# Patient Record
Sex: Female | Born: 1965
Health system: Southern US, Community
[De-identification: ages and names within clinical notes are randomized; demographics above are authoritative.]

## PROBLEM LIST (undated history)

## (undated) DIAGNOSIS — I10 Essential (primary) hypertension: Secondary | ICD-10-CM

## (undated) HISTORY — DX: Essential (primary) hypertension: I10

## (undated) HISTORY — PX: WRIST SURGERY: SHX841

---

## 1987-08-30 HISTORY — PX: UPPER GI ENDOSCOPY: SHX6162

## 2015-06-18 ENCOUNTER — Other Ambulatory Visit: Payer: Self-pay | Admitting: Obstetrics & Gynecology

## 2015-06-18 DIAGNOSIS — R928 Other abnormal and inconclusive findings on diagnostic imaging of breast: Secondary | ICD-10-CM

## 2015-06-24 ENCOUNTER — Ambulatory Visit
Admission: RE | Admit: 2015-06-24 | Discharge: 2015-06-24 | Disposition: A | Discharge status: Home / Self Care | Payer: Commercial Managed Care - PPO | Source: Ambulatory Visit | Attending: Obstetrics & Gynecology | Admitting: Obstetrics & Gynecology

## 2015-06-24 DIAGNOSIS — R928 Other abnormal and inconclusive findings on diagnostic imaging of breast: Secondary | ICD-10-CM

## 2015-06-25 ENCOUNTER — Other Ambulatory Visit: Payer: Self-pay

## 2015-11-17 ENCOUNTER — Other Ambulatory Visit: Payer: Self-pay | Admitting: Obstetrics & Gynecology

## 2015-11-17 DIAGNOSIS — N6489 Other specified disorders of breast: Secondary | ICD-10-CM

## 2015-11-17 DIAGNOSIS — N63 Unspecified lump in unspecified breast: Secondary | ICD-10-CM

## 2015-12-24 ENCOUNTER — Ambulatory Visit
Admission: RE | Admit: 2015-12-24 | Discharge: 2015-12-24 | Disposition: A | Discharge status: Home / Self Care | Payer: Commercial Managed Care - PPO | Source: Ambulatory Visit | Attending: Obstetrics & Gynecology | Admitting: Obstetrics & Gynecology

## 2015-12-24 DIAGNOSIS — N6489 Other specified disorders of breast: Secondary | ICD-10-CM

## 2016-05-04 ENCOUNTER — Other Ambulatory Visit: Payer: Self-pay | Admitting: Obstetrics & Gynecology

## 2016-05-04 DIAGNOSIS — Z1231 Encounter for screening mammogram for malignant neoplasm of breast: Secondary | ICD-10-CM

## 2016-06-24 ENCOUNTER — Ambulatory Visit
Admission: RE | Admit: 2016-06-24 | Discharge: 2016-06-24 | Disposition: A | Discharge status: Home / Self Care | Payer: Commercial Managed Care - PPO | Source: Ambulatory Visit | Attending: Obstetrics & Gynecology | Admitting: Obstetrics & Gynecology

## 2016-06-24 DIAGNOSIS — Z1231 Encounter for screening mammogram for malignant neoplasm of breast: Secondary | ICD-10-CM

## 2016-06-27 ENCOUNTER — Ambulatory Visit: Payer: Commercial Managed Care - PPO

## 2016-06-29 ENCOUNTER — Other Ambulatory Visit: Payer: Self-pay | Admitting: Obstetrics & Gynecology

## 2016-06-29 DIAGNOSIS — R928 Other abnormal and inconclusive findings on diagnostic imaging of breast: Secondary | ICD-10-CM

## 2016-07-05 ENCOUNTER — Ambulatory Visit
Admission: RE | Admit: 2016-07-05 | Discharge: 2016-07-05 | Disposition: A | Discharge status: Home / Self Care | Payer: Commercial Managed Care - PPO | Source: Ambulatory Visit | Attending: Obstetrics & Gynecology | Admitting: Obstetrics & Gynecology

## 2016-07-05 DIAGNOSIS — R928 Other abnormal and inconclusive findings on diagnostic imaging of breast: Secondary | ICD-10-CM

## 2017-02-08 DIAGNOSIS — M9903 Segmental and somatic dysfunction of lumbar region: Secondary | ICD-10-CM | POA: Diagnosis not present

## 2017-02-08 DIAGNOSIS — M545 Low back pain: Secondary | ICD-10-CM | POA: Diagnosis not present

## 2017-02-08 DIAGNOSIS — M9905 Segmental and somatic dysfunction of pelvic region: Secondary | ICD-10-CM | POA: Diagnosis not present

## 2017-02-10 DIAGNOSIS — M545 Low back pain: Secondary | ICD-10-CM | POA: Diagnosis not present

## 2017-02-10 DIAGNOSIS — M9903 Segmental and somatic dysfunction of lumbar region: Secondary | ICD-10-CM | POA: Diagnosis not present

## 2017-02-10 DIAGNOSIS — M9905 Segmental and somatic dysfunction of pelvic region: Secondary | ICD-10-CM | POA: Diagnosis not present

## 2017-03-17 DIAGNOSIS — D2271 Melanocytic nevi of right lower limb, including hip: Secondary | ICD-10-CM | POA: Diagnosis not present

## 2017-03-17 DIAGNOSIS — L700 Acne vulgaris: Secondary | ICD-10-CM | POA: Diagnosis not present

## 2017-03-17 DIAGNOSIS — L821 Other seborrheic keratosis: Secondary | ICD-10-CM | POA: Diagnosis not present

## 2017-04-28 DIAGNOSIS — R030 Elevated blood-pressure reading, without diagnosis of hypertension: Secondary | ICD-10-CM | POA: Diagnosis not present

## 2017-04-28 DIAGNOSIS — M545 Low back pain: Secondary | ICD-10-CM | POA: Diagnosis not present

## 2017-04-28 DIAGNOSIS — Z131 Encounter for screening for diabetes mellitus: Secondary | ICD-10-CM | POA: Diagnosis not present

## 2017-04-28 DIAGNOSIS — Z13220 Encounter for screening for lipoid disorders: Secondary | ICD-10-CM | POA: Diagnosis not present

## 2017-05-04 ENCOUNTER — Other Ambulatory Visit: Payer: Self-pay | Admitting: Obstetrics & Gynecology

## 2017-05-04 DIAGNOSIS — Z1231 Encounter for screening mammogram for malignant neoplasm of breast: Secondary | ICD-10-CM

## 2017-06-26 ENCOUNTER — Ambulatory Visit
Admission: RE | Admit: 2017-06-26 | Discharge: 2017-06-26 | Disposition: A | Discharge status: Home / Self Care | Payer: Commercial Managed Care - PPO | Source: Ambulatory Visit | Attending: Obstetrics & Gynecology | Admitting: Obstetrics & Gynecology

## 2017-06-26 DIAGNOSIS — Z1231 Encounter for screening mammogram for malignant neoplasm of breast: Secondary | ICD-10-CM | POA: Diagnosis not present

## 2017-06-26 DIAGNOSIS — Z6824 Body mass index (BMI) 24.0-24.9, adult: Secondary | ICD-10-CM | POA: Diagnosis not present

## 2017-06-26 DIAGNOSIS — Z01419 Encounter for gynecological examination (general) (routine) without abnormal findings: Secondary | ICD-10-CM | POA: Diagnosis not present

## 2017-07-11 ENCOUNTER — Telehealth: Payer: Self-pay | Admitting: *Deleted

## 2017-07-11 NOTE — Telephone Encounter (Signed)
New patient referral received and contacted the patient with the appt of December 3rd at 10:15am. Also contacted the referring office with the appt.

## 2017-07-24 ENCOUNTER — Telehealth: Payer: Self-pay | Admitting: *Deleted

## 2017-07-24 NOTE — Telephone Encounter (Signed)
Patient called and left a message to cancel her appt for December 3rd. Appt canceled and contacted the patient. Patient doesn't want to reschedule

## 2017-07-31 ENCOUNTER — Ambulatory Visit: Payer: Commercial Managed Care - PPO | Admitting: Gynecologic Oncology

## 2017-08-01 DIAGNOSIS — I10 Essential (primary) hypertension: Secondary | ICD-10-CM | POA: Diagnosis not present

## 2017-08-03 DIAGNOSIS — I10 Essential (primary) hypertension: Secondary | ICD-10-CM | POA: Diagnosis not present

## 2017-08-09 DIAGNOSIS — I10 Essential (primary) hypertension: Secondary | ICD-10-CM | POA: Diagnosis not present

## 2017-08-23 DIAGNOSIS — N920 Excessive and frequent menstruation with regular cycle: Secondary | ICD-10-CM | POA: Diagnosis not present

## 2017-08-23 DIAGNOSIS — I10 Essential (primary) hypertension: Secondary | ICD-10-CM | POA: Diagnosis not present

## 2017-08-23 DIAGNOSIS — R102 Pelvic and perineal pain: Secondary | ICD-10-CM | POA: Diagnosis not present

## 2017-08-23 HISTORY — PX: PARTIAL HYSTERECTOMY: SHX80

## 2017-10-25 DIAGNOSIS — I10 Essential (primary) hypertension: Secondary | ICD-10-CM | POA: Diagnosis not present

## 2017-10-25 DIAGNOSIS — Z79899 Other long term (current) drug therapy: Secondary | ICD-10-CM | POA: Diagnosis not present

## 2017-11-15 DIAGNOSIS — M791 Myalgia, unspecified site: Secondary | ICD-10-CM | POA: Diagnosis not present

## 2017-11-15 DIAGNOSIS — R51 Headache: Secondary | ICD-10-CM | POA: Diagnosis not present

## 2017-11-15 DIAGNOSIS — J069 Acute upper respiratory infection, unspecified: Secondary | ICD-10-CM | POA: Diagnosis not present

## 2017-11-17 DIAGNOSIS — R300 Dysuria: Secondary | ICD-10-CM | POA: Diagnosis not present

## 2017-11-17 DIAGNOSIS — N390 Urinary tract infection, site not specified: Secondary | ICD-10-CM | POA: Diagnosis not present

## 2018-01-17 ENCOUNTER — Encounter: Payer: Self-pay | Admitting: Gastroenterology

## 2018-01-26 ENCOUNTER — Encounter: Payer: Self-pay | Admitting: Gastroenterology

## 2018-01-26 ENCOUNTER — Ambulatory Visit (INDEPENDENT_AMBULATORY_CARE_PROVIDER_SITE_OTHER): Payer: Commercial Managed Care - PPO | Admitting: Gastroenterology

## 2018-01-26 VITALS — BP 150/88 | HR 70 | Ht 65.0 in | Wt 144.4 lb

## 2018-01-26 DIAGNOSIS — Z1211 Encounter for screening for malignant neoplasm of colon: Secondary | ICD-10-CM

## 2018-01-26 MED ORDER — SOD PICOSULFATE-MAG OX-CIT ACD 10-3.5-12 MG-GM -GM/160ML PO SOLN: 1.0000 | Freq: Once | ORAL | 0 refills | Status: AC

## 2018-01-26 NOTE — Progress Notes (Signed)
Chief Complaint: For colorectal cancer screening  Referring Provider:  Lind Guest, NP      ASSESSMENT AND PLAN;   #1. Colorectal Screening. Proceed with colonoscopy with Clenpiq.  I have discussed the risks and benefits.  The risks including risk of perforation requiring laparotomy, bleeding after polypectomy requiring blood transfusions and risks of anesthesia/sedation were discussed.  Rare risks of missing colorectal neoplasms were also discussed.  Alternatives were given.  Patient is fully aware and agrees to proceed. All the questions were answered. Colonoscopy will be scheduled in upcoming days.  Patient is to report immediately if there is any significant weight loss or excessive bleeding until then. Consent forms were given for review.     HPI:    Isabella Rodriguez is a 52 y.o. female  No nausea, vomiting, heartburn, regurgitation, odynophagia or dysphagia.  No significant diarrhea or constipation.  There is no melena or hematochezia. No unintentional weight loss.   No past medical history on file.  Past Surgical History:  Procedure Laterality Date  . PARTIAL HYSTERECTOMY  08/23/2017  . UPPER GI ENDOSCOPY  1989   Dr. Melina Copa    Family History  Problem Relation Age of Onset  . Liver cancer Father   . Lung cancer Father   . Colon polyps Father     Social History   Tobacco Use  . Smoking status: Never Smoker  . Smokeless tobacco: Never Used  Substance Use Topics  . Alcohol use: Yes    Alcohol/week: 1.2 oz    Types: 2 Glasses of wine per week  . Drug use: Never    Current Outpatient Medications  Medication Sig Dispense Refill  . acetaminophen (TYLENOL) 500 MG tablet Take 500 mg by mouth every 6 (six) hours as needed.    . Ibuprofen (ADVIL) 200 MG CAPS Take 400 mg by mouth 2 (two) times daily as needed.    Marland Kitchen lisinopril (PRINIVIL,ZESTRIL) 10 MG tablet Take 10 mg by mouth daily.    . Naproxen Sodium (ALEVE) 220 MG CAPS Take 200 mg by mouth as needed.      No current facility-administered medications for this visit.     Allergies  Allergen Reactions  . Aspirin     Nose bleeds as a child    Review of Systems:  Constitutional: Denies fever, chills, diaphoresis, appetite change and fatigue.  HEENT: Denies photophobia, eye pain, redness, hearing loss, ear pain, congestion, sore throat, rhinorrhea, sneezing, mouth sores, neck pain, neck stiffness and tinnitus.   Respiratory: Denies SOB, DOE, cough, chest tightness,  and wheezing.   Cardiovascular: Denies chest pain, palpitations and leg swelling.  Genitourinary: Denies dysuria, urgency, frequency, hematuria, flank pain and difficulty urinating.  Musculoskeletal: Denies myalgias, back pain, joint swelling, arthralgias and gait problem.  Skin: No rash.  Neurological: Denies dizziness, seizures, syncope, weakness, light-headedness, numbness and headaches.  Hematological: Denies adenopathy. Easy bruising, personal or family bleeding history  Psychiatric/Behavioral: No anxiety or depression     Physical Exam:    BP (!) 150/88   Pulse 70   Ht 5\' 5"  (1.651 m)   Wt 144 lb 6 oz (65.5 kg)   LMP 06/12/2017   BMI 24.03 kg/m  Filed Weights   01/26/18 1012  Weight: 144 lb 6 oz (65.5 kg)   Constitutional:  Well-developed, in no acute distress. Psychiatric: Normal mood and affect. Behavior is normal. HEENT: Pupils normal.  Conjunctivae are normal. No scleral icterus. Neck supple.  Cardiovascular: Normal rate, regular rhythm. No edema  Pulmonary/chest: Effort normal and breath sounds normal. No wheezing, rales or rhonchi. Abdominal: Soft, nondistended. Nontender. Bowel sounds active throughout. There are no masses palpable. No hepatomegaly. Rectal:  defered Neurological: Alert and oriented to person place and time. Skin: Skin is warm and dry. No rashes noted.   Radiology Studies: No results found.    Carmell Austria, MD 01/26/2018, 10:28 AM  Cc: Lind Guest, NP

## 2018-01-26 NOTE — Patient Instructions (Signed)
If you are age 52 or older, your body mass index should be between 23-30. Your Body mass index is 24.03 kg/m. If this is out of the aforementioned range listed, please consider follow up with your Primary Care Provider.  If you are age 6 or younger, your body mass index should be between 19-25. Your Body mass index is 24.03 kg/m. If this is out of the aformentioned range listed, please consider follow up with your Primary Care Provider.   You have been scheduled for a colonoscopy. Please follow written instructions given to you at your visit today.  Please pick up your prep supplies at the pharmacy within the next 1-3 days. If you use inhalers (even only as needed), please bring them with you on the day of your procedure. Your physician has requested that you go to www.startemmi.com and enter the access code given to you at your visit today. This web site gives a general overview about your procedure. However, you should still follow specific instructions given to you by our office regarding your preparation for the procedure.  We have given you samples of the following medication to take: Clenpiq  Thank you,  Dr. Jackquline Denmark

## 2018-01-26 NOTE — Progress Notes (Signed)
cenpambul

## 2018-02-05 ENCOUNTER — Ambulatory Visit (AMBULATORY_SURGERY_CENTER): Payer: Commercial Managed Care - PPO | Admitting: Gastroenterology

## 2018-02-05 ENCOUNTER — Encounter: Payer: Self-pay | Admitting: Gastroenterology

## 2018-02-05 VITALS — BP 143/89 | HR 70 | Temp 98.7°F | Resp 14 | Ht 65.0 in | Wt 144.0 lb

## 2018-02-05 DIAGNOSIS — Z1211 Encounter for screening for malignant neoplasm of colon: Secondary | ICD-10-CM

## 2018-02-05 DIAGNOSIS — Z8371 Family history of colonic polyps: Secondary | ICD-10-CM | POA: Diagnosis not present

## 2018-02-05 MED ORDER — SODIUM CHLORIDE 0.9 % IV SOLN: 500.0000 mL | Freq: Once | INTRAVENOUS | Status: AC

## 2018-02-05 NOTE — Progress Notes (Signed)
A/ox3 pleased with MAC, report to RN 

## 2018-02-05 NOTE — Progress Notes (Signed)
Pt's states no medical or surgical changes since previsit or office visit. 

## 2018-02-05 NOTE — Op Note (Signed)
[facility] Patient Name: Isabella Rodriguez Procedure Date: 02/05/2018 10:39 AM MRN: [id number] Endoscopist: Jackquline Denmark , MD Age: 52 Referring MD:  Date of Birth: 24-Mar-1966 Gender: Female Account #: [id number] Procedure:                Colonoscopy Indications:              Screening for colorectal malignant neoplasm, family                            history of polyps Medicines:                Monitored Anesthesia Care Procedure:                Pre-Anesthesia Assessment:                           - Prior to the procedure, a History and Physical                            was performed, and patient medications and                            allergies were reviewed. The patient is competent.                            The risks and benefits of the procedure and the                            sedation options and risks were discussed with the                            patient. All questions were answered and informed                            consent was obtained. Patient identification and                            proposed procedure were verified by the physician                            in the procedure room. Mental Status Examination:                            alert and oriented. Prophylactic Antibiotics: The                            patient does not require prophylactic antibiotics.                            Prior Anticoagulants: The patient has taken no                            previous anticoagulant or antiplatelet agents. ASA  Grade Assessment: I - A normal, healthy patient.                            After reviewing the risks and benefits, the patient                            was deemed in satisfactory condition to undergo the                            procedure. The anesthesia plan was to use monitored                            anesthesia care (MAC). Immediately prior to                            administration of medications,  the patient was                            re-assessed for adequacy to receive sedatives. The                            heart rate, respiratory rate, oxygen saturations,                            blood pressure, adequacy of pulmonary ventilation,                            and response to care were monitored throughout the                            procedure. The physical status of the patient was                            re-assessed after the procedure.                           After obtaining informed consent, the colonoscope                            was passed under direct vision. Throughout the                            procedure, the patient's blood pressure, pulse, and                            oxygen saturations were monitored continuously. The                            Colonoscope was introduced through the anus and                            advanced to the 2 cm into the ileum. The  colonoscopy was performed without difficulty. The                            patient tolerated the procedure well. The quality                            of the bowel preparation was excellent. Scope In: 10:46:49 AM Scope Out: 11:00:41 AM Scope Withdrawal Time: 0 hours 9 minutes 12 seconds  Total Procedure Duration: 0 hours 13 minutes 52 seconds  Findings:                 Non-bleeding internal hemorrhoids were found during                            retroflexion. The hemorrhoids were small.                           The entire examined colon appeared normal on direct                            and retroflexion views. Complications:            No immediate complications. Estimated Blood Loss:     Estimated blood loss: none. Impression:               - Small internal hemorrhoids.                           - Otherwise normal colonoscopy to terminal ileum. Recommendation:           - Patient has a contact number available for                            emergencies.  The signs and symptoms of potential                            delayed complications were discussed with the                            patient. Return to normal activities tomorrow.                            Written discharge instructions were provided to the                            patient.                           - Resume previous diet.                           - Continue present medications.                           - Repeat colonoscopy in 5 years for screening  purposes due to positive family history of colonic                            polyps.                           - Return to GI clinic PRN. Jackquline Denmark, MD 02/05/2018 11:05:24 AM This report has been signed electronically.

## 2018-02-05 NOTE — Progress Notes (Signed)
No problems noted in the recovery room. maw 

## 2018-02-05 NOTE — Patient Instructions (Signed)
YOU HAD AN ENDOSCOPIC PROCEDURE TODAY AT [facility]:   Refer to the procedure report that was given to you for any specific questions about what was found during the examination.  If the procedure report does not answer your questions, please call your gastroenterologist to clarify.  If you requested that your care partner not be given the details of your procedure findings, then the procedure report has been included in a sealed envelope for you to review at your convenience later.  YOU SHOULD EXPECT: Some feelings of bloating in the abdomen. Passage of more gas than usual.  Walking can help get rid of the air that was put into your GI tract during the procedure and reduce the bloating. If you had a lower endoscopy (such as a colonoscopy or flexible sigmoidoscopy) you may notice spotting of blood in your stool or on the toilet paper. If you underwent a bowel prep for your procedure, you may not have a normal bowel movement for a few days.  Please Note:  You might notice some irritation and congestion in your nose or some drainage.  This is from the oxygen used during your procedure.  There is no need for concern and it should clear up in a day or so.  SYMPTOMS TO REPORT IMMEDIATELY:   Following lower endoscopy (colonoscopy or flexible sigmoidoscopy):  Excessive amounts of blood in the stool  Significant tenderness or worsening of abdominal pains  Swelling of the abdomen that is new, acute  Fever of 100F or higher   For urgent or emergent issues, a gastroenterologist can be reached at any hour by calling [phone].   DIET:  We do recommend a small meal at first, but then you may proceed to your regular diet.  Drink plenty of fluids but you should avoid alcoholic beverages for 24 hours.  ACTIVITY:  You should plan to take it easy for the rest of today and you should NOT DRIVE or use heavy machinery until tomorrow (because of the sedation medicines used during the test).     FOLLOW UP: Our staff will call the number listed on your records the next business day following your procedure to check on you and address any questions or concerns that you may have regarding the information given to you following your procedure. If we do not reach you, we will leave a message.  However, if you are feeling well and you are not experiencing any problems, there is no need to return our call.  We will assume that you have returned to your regular daily activities without incident.  If any biopsies were taken you will be contacted by phone or by letter within the next 1-3 weeks.  Please call us at [phone] if you have not heard about the biopsies in 3 weeks.    SIGNATURES/CONFIDENTIALITY: You and/or your care partner have signed paperwork which will be entered into your electronic medical record.  These signatures attest to the fact that that the information above on your After Visit Summary has been reviewed and is understood.  Full responsibility of the confidentiality of this discharge information lies with you and/or your care-partner.    Handout was given to your care partner on hemorrhoids. You may resume your current medications today. Repeat colonoscopy in 5 years for screening purposes due to positive family history of colonic polyps. Please call if any questions or concerns.

## 2018-02-06 ENCOUNTER — Telehealth: Payer: Self-pay

## 2018-02-06 NOTE — Telephone Encounter (Signed)
  Follow up Call-  Call back number 02/05/2018  Post procedure Call Back phone  # [phone]  Permission to leave phone message Yes     Patient questions:  Do you have a fever, pain , or abdominal swelling? No. Pain Score  0 *  Have you tolerated food without any problems? Yes.    Have you been able to return to your normal activities? Yes.    Do you have any questions about your discharge instructions: Diet   No. Medications  No. Follow up visit  No.  Do you have questions or concerns about your Care? No.  Actions: * If pain score is 4 or above: No action needed, pain <4.

## 2018-04-12 DIAGNOSIS — D2362 Other benign neoplasm of skin of left upper limb, including shoulder: Secondary | ICD-10-CM | POA: Diagnosis not present

## 2018-04-12 DIAGNOSIS — D2271 Melanocytic nevi of right lower limb, including hip: Secondary | ICD-10-CM | POA: Diagnosis not present

## 2018-04-12 DIAGNOSIS — D2372 Other benign neoplasm of skin of left lower limb, including hip: Secondary | ICD-10-CM | POA: Diagnosis not present

## 2018-04-24 DIAGNOSIS — Z6824 Body mass index (BMI) 24.0-24.9, adult: Secondary | ICD-10-CM | POA: Diagnosis not present

## 2018-04-24 DIAGNOSIS — I10 Essential (primary) hypertension: Secondary | ICD-10-CM | POA: Diagnosis not present

## 2018-05-17 ENCOUNTER — Other Ambulatory Visit: Payer: Self-pay | Admitting: Obstetrics & Gynecology

## 2018-05-17 DIAGNOSIS — Z1231 Encounter for screening mammogram for malignant neoplasm of breast: Secondary | ICD-10-CM

## 2018-05-29 DIAGNOSIS — M25551 Pain in right hip: Secondary | ICD-10-CM | POA: Diagnosis not present

## 2018-05-29 DIAGNOSIS — M545 Low back pain: Secondary | ICD-10-CM | POA: Diagnosis not present

## 2018-05-29 DIAGNOSIS — M25552 Pain in left hip: Secondary | ICD-10-CM | POA: Diagnosis not present

## 2018-05-30 ENCOUNTER — Other Ambulatory Visit: Payer: Self-pay | Admitting: Family

## 2018-05-30 ENCOUNTER — Ambulatory Visit
Admission: RE | Admit: 2018-05-30 | Discharge: 2018-05-30 | Disposition: A | Discharge status: Home / Self Care | Payer: Commercial Managed Care - PPO | Source: Ambulatory Visit | Attending: Family | Admitting: Family

## 2018-05-30 DIAGNOSIS — M545 Low back pain, unspecified: Secondary | ICD-10-CM

## 2018-05-30 DIAGNOSIS — M25551 Pain in right hip: Secondary | ICD-10-CM

## 2018-05-30 DIAGNOSIS — M25552 Pain in left hip: Secondary | ICD-10-CM

## 2018-05-30 DIAGNOSIS — M48061 Spinal stenosis, lumbar region without neurogenic claudication: Secondary | ICD-10-CM | POA: Diagnosis not present

## 2018-06-04 DIAGNOSIS — M25552 Pain in left hip: Secondary | ICD-10-CM | POA: Diagnosis not present

## 2018-06-27 ENCOUNTER — Ambulatory Visit
Admission: RE | Admit: 2018-06-27 | Discharge: 2018-06-27 | Disposition: A | Discharge status: Home / Self Care | Payer: Commercial Managed Care - PPO | Source: Ambulatory Visit | Attending: Obstetrics & Gynecology | Admitting: Obstetrics & Gynecology

## 2018-06-27 DIAGNOSIS — Z1231 Encounter for screening mammogram for malignant neoplasm of breast: Secondary | ICD-10-CM | POA: Diagnosis not present

## 2018-07-09 DIAGNOSIS — Z01419 Encounter for gynecological examination (general) (routine) without abnormal findings: Secondary | ICD-10-CM | POA: Diagnosis not present

## 2018-10-02 DIAGNOSIS — M654 Radial styloid tenosynovitis [de Quervain]: Secondary | ICD-10-CM | POA: Diagnosis not present

## 2018-11-13 DIAGNOSIS — M654 Radial styloid tenosynovitis [de Quervain]: Secondary | ICD-10-CM | POA: Diagnosis not present

## 2019-01-08 IMAGING — MG 2D DIGITAL SCREENING BILATERAL MAMMOGRAM WITH CAD AND ADJUNCT TO
8 of 12 series · 8 of 28 positions shown · non-contrast
Comparison: Previous exam(s).

CLINICAL DATA: Screening.

EXAM:
2D DIGITAL SCREENING BILATERAL MAMMOGRAM WITH CAD AND ADJUNCT TOMO

[R CC]
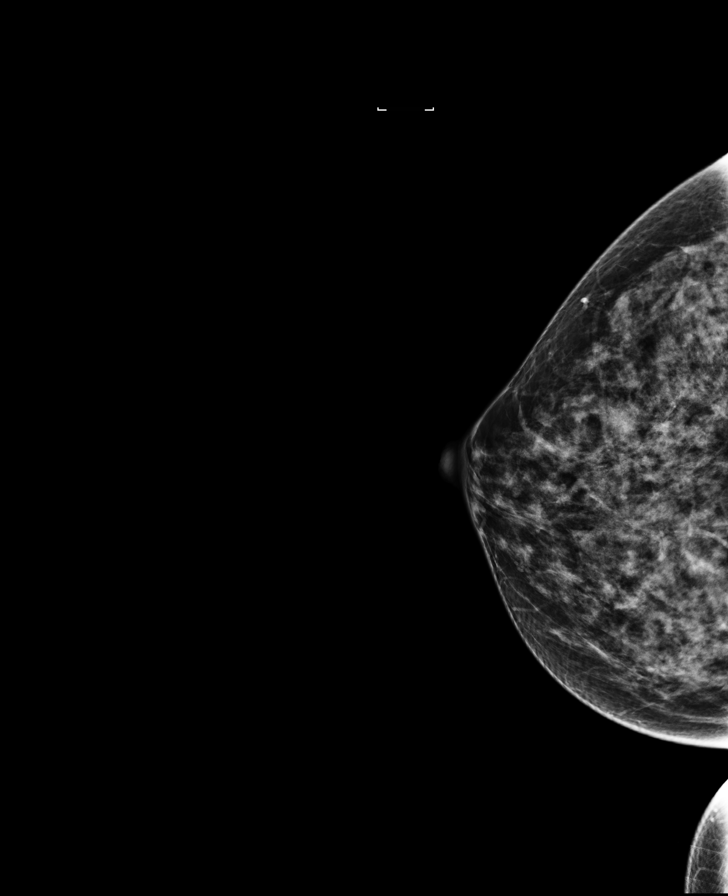

[L CC]
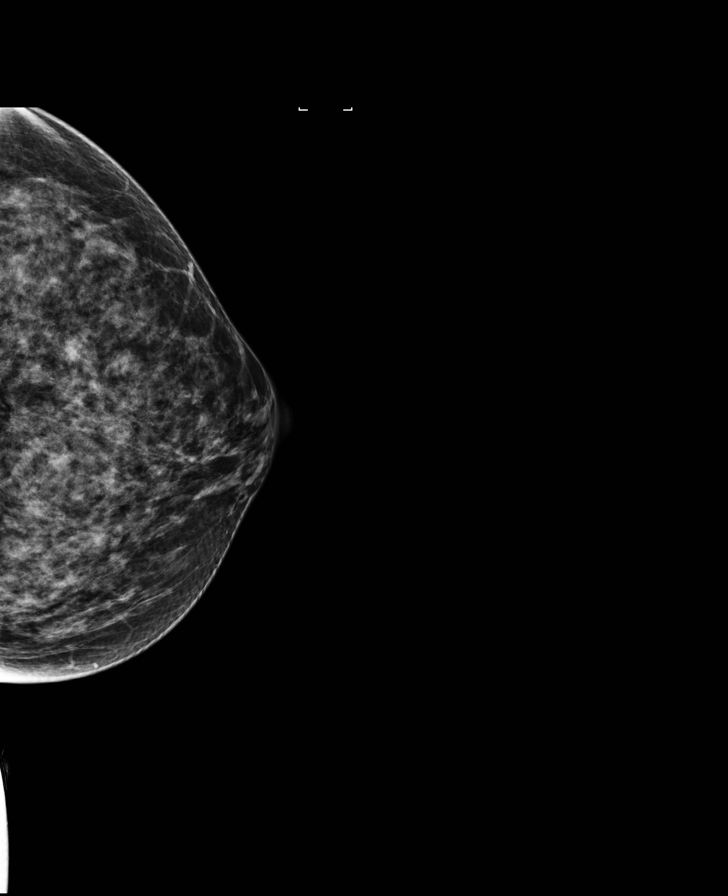

[L MLO]
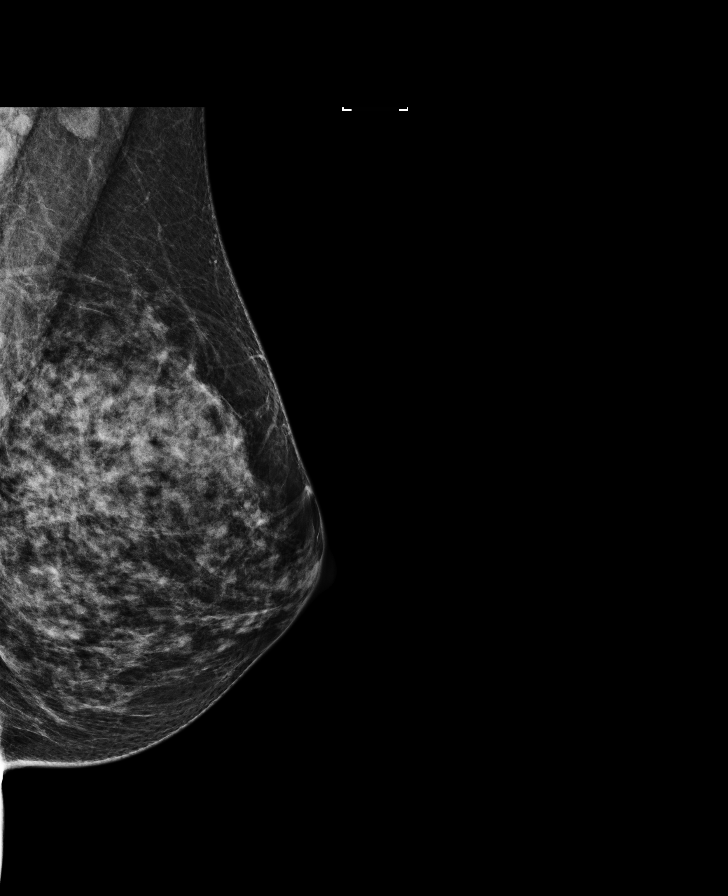

[R MLO]
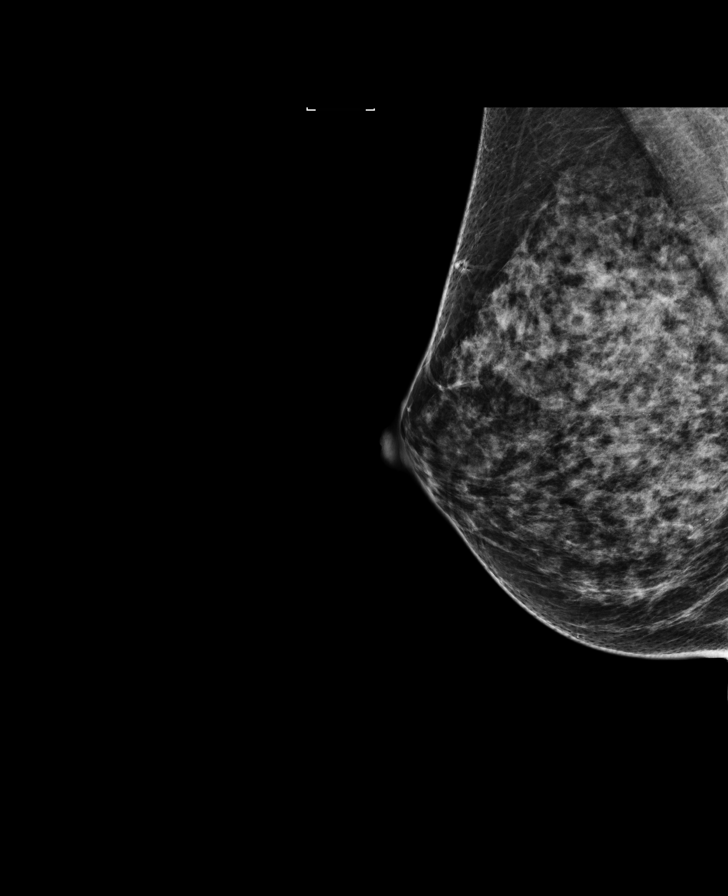

[R MLO synth-2D]
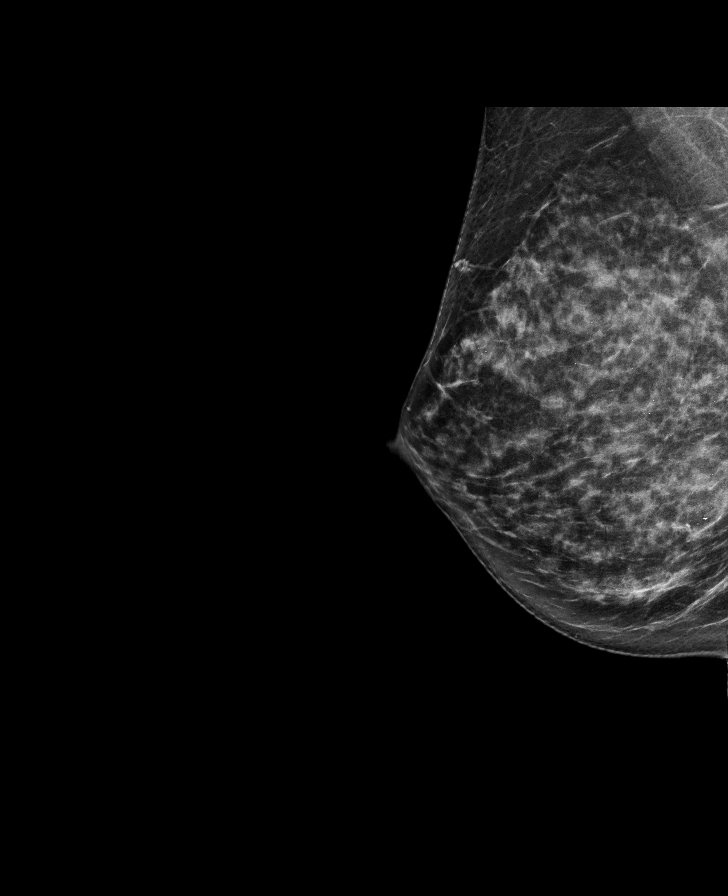

[L MLO synth-2D]
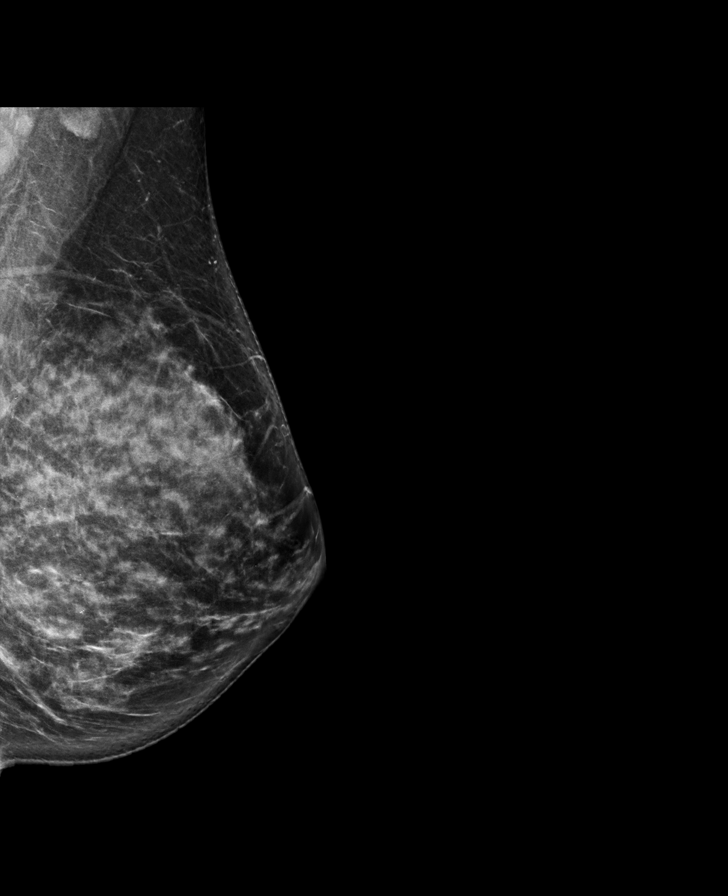

[R CC synth-2D]
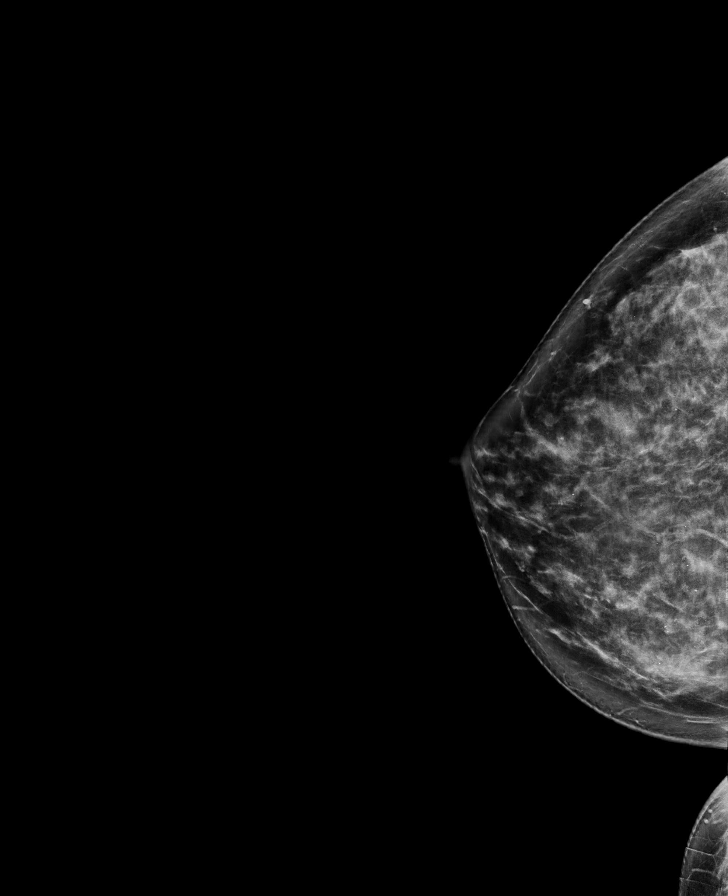

[L CC synth-2D]
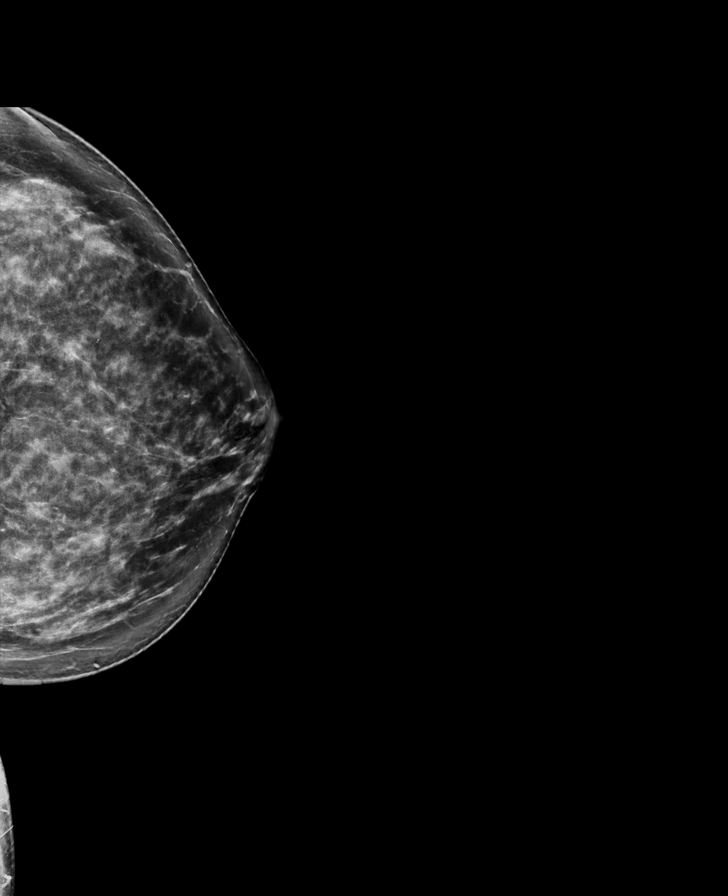

[8 of 28 positions shown; findings below may reference images not displayed]

ACR Breast Density Category c: The breast tissue is heterogeneously
dense, which may obscure small masses.
FINDINGS: There are no findings suspicious for malignancy. Images were
processed with CAD.
IMPRESSION: No mammographic evidence of malignancy. A result letter of this
screening mammogram will be mailed directly to the patient.

RECOMMENDATION:
Screening mammogram in one year. (Code:TN-[id number])

BI-RADS CATEGORY  1: Negative.

## 2019-05-15 ENCOUNTER — Other Ambulatory Visit: Payer: Self-pay | Admitting: Obstetrics & Gynecology

## 2019-05-15 DIAGNOSIS — Z1231 Encounter for screening mammogram for malignant neoplasm of breast: Secondary | ICD-10-CM

## 2019-07-01 ENCOUNTER — Other Ambulatory Visit: Payer: Self-pay

## 2019-07-01 ENCOUNTER — Ambulatory Visit
Admission: RE | Admit: 2019-07-01 | Discharge: 2019-07-01 | Disposition: A | Discharge status: Home / Self Care | Payer: Commercial Managed Care - PPO | Source: Ambulatory Visit | Attending: Obstetrics & Gynecology | Admitting: Obstetrics & Gynecology

## 2019-07-01 DIAGNOSIS — Z1231 Encounter for screening mammogram for malignant neoplasm of breast: Secondary | ICD-10-CM

## 2020-05-18 ENCOUNTER — Other Ambulatory Visit: Payer: Self-pay | Admitting: Obstetrics & Gynecology

## 2020-05-18 DIAGNOSIS — Z1231 Encounter for screening mammogram for malignant neoplasm of breast: Secondary | ICD-10-CM

## 2020-07-01 ENCOUNTER — Other Ambulatory Visit: Payer: Self-pay

## 2020-07-01 ENCOUNTER — Ambulatory Visit
Admission: RE | Admit: 2020-07-01 | Discharge: 2020-07-01 | Disposition: A | Discharge status: Home / Self Care | Payer: Commercial Managed Care - PPO | Source: Ambulatory Visit | Attending: Obstetrics & Gynecology | Admitting: Obstetrics & Gynecology

## 2020-07-01 DIAGNOSIS — Z1231 Encounter for screening mammogram for malignant neoplasm of breast: Secondary | ICD-10-CM

## 2021-05-04 ENCOUNTER — Emergency Department (HOSPITAL_COMMUNITY): Payer: Managed Care, Other (non HMO)

## 2021-05-04 ENCOUNTER — Other Ambulatory Visit: Payer: Self-pay

## 2021-05-04 ENCOUNTER — Emergency Department (HOSPITAL_COMMUNITY)
Admission: EM | Admit: 2021-05-04 | Discharge: 2021-05-04 | Disposition: A | Discharge status: Home / Self Care | Payer: Managed Care, Other (non HMO) | Attending: Emergency Medicine | Admitting: Emergency Medicine

## 2021-05-04 DIAGNOSIS — R42 Dizziness and giddiness: Secondary | ICD-10-CM | POA: Diagnosis not present

## 2021-05-04 DIAGNOSIS — R197 Diarrhea, unspecified: Secondary | ICD-10-CM | POA: Diagnosis not present

## 2021-05-04 DIAGNOSIS — R55 Syncope and collapse: Secondary | ICD-10-CM

## 2021-05-04 DIAGNOSIS — R61 Generalized hyperhidrosis: Secondary | ICD-10-CM | POA: Diagnosis not present

## 2021-05-04 LAB — CBC WITH DIFFERENTIAL/PLATELET
Abs Immature Granulocytes: 0.03 10*3/uL (ref 0.00–0.07)
Basophils Absolute: 0.1 10*3/uL (ref 0.0–0.1)
Basophils Relative: 1 %
Eosinophils Absolute: 0.2 10*3/uL (ref 0.0–0.5)
Eosinophils Relative: 3 %
HCT: 41.4 % (ref 36.0–46.0)
Hemoglobin: 13.5 g/dL (ref 12.0–15.0)
Immature Granulocytes: 0 %
Lymphocytes Relative: 28 %
Lymphs Abs: 2 10*3/uL (ref 0.7–4.0)
MCH: 31.4 pg (ref 26.0–34.0)
MCHC: 32.6 g/dL (ref 30.0–36.0)
MCV: 96.3 fL (ref 80.0–100.0)
Monocytes Absolute: 0.6 10*3/uL (ref 0.1–1.0)
Monocytes Relative: 8 %
Neutro Abs: 4.2 10*3/uL (ref 1.7–7.7)
Neutrophils Relative %: 60 %
Platelets: 227 10*3/uL (ref 150–400)
RBC: 4.3 MIL/uL (ref 3.87–5.11)
RDW: 12.6 % (ref 11.5–15.5)
WBC: 7.1 10*3/uL (ref 4.0–10.5)
nRBC: 0 % (ref 0.0–0.2)

## 2021-05-04 LAB — COMPREHENSIVE METABOLIC PANEL
ALT: 18 U/L (ref 0–44)
AST: 31 U/L (ref 15–41)
Albumin: 3.8 g/dL (ref 3.5–5.0)
Alkaline Phosphatase: 54 U/L (ref 38–126)
Anion gap: 6 (ref 5–15)
BUN: 15 mg/dL (ref 6–20)
CO2: 24 mmol/L (ref 22–32)
Calcium: 8.8 mg/dL — ABNORMAL LOW (ref 8.9–10.3)
Chloride: 107 mmol/L (ref 98–111)
Creatinine, Ser: 0.84 mg/dL (ref 0.44–1.00)
GFR, Estimated: >60 mL/min (ref >60)
Glucose, Bld: 108 mg/dL — ABNORMAL HIGH (ref 70–99)
Potassium: 4.5 mmol/L (ref 3.5–5.1)
Sodium: 137 mmol/L (ref 135–145)
Total Bilirubin: 0.8 mg/dL (ref 0.3–1.2)
Total Protein: 7 g/dL (ref 6.5–8.1)

## 2021-05-04 MED ORDER — ACETAMINOPHEN 325 MG PO TABS
325.0000 mg | ORAL_TABLET | Freq: Once | ORAL | Status: AC
  Administered 2021-05-04: 325 mg via ORAL
  Filled 2021-05-04: qty 1

## 2021-05-04 NOTE — ED Notes (Signed)
Patient ambulatory to restroom without issue.

## 2021-05-04 NOTE — ED Triage Notes (Signed)
Pt woke this morning with abd pain. Attempted bowel movement. Had diarrhea and experienced syncopal episode. Pt fell to floor and hit head. Husband reports pt was "out of it" for ~48mn. Husband also reports second syncopal episode while still on floor. Pt reports becoming diaphoretic and dizzy before event. Pt reports HA and N at this time.

## 2021-05-04 NOTE — ED Notes (Signed)
Patient verbalizes understanding of discharge instructions. Opportunity for questioning and answers were provided. Arm band removed by staff, patient discharged from ED. 

## 2021-05-04 NOTE — Discharge Instructions (Addendum)
As we discussed based on your presentation today, and reassuring lab work-up I believe that you had an episode of vasovagal syncope.  Please continue to monitor your health over the next few days, please return if you begin to have an intractable headache, fever, numbness, tingling, weakness, new palpitations, or further episodes of syncope.

## 2021-05-04 NOTE — ED Provider Notes (Signed)
[facility]PART[facility] Provider Note   CSN: [id number] Arrival date & time: 05/04/21  0752     History Chief Complaint  Patient presents with   Loss of Consciousness    Isabella Rodriguez is a 55 y.o. female with no significant past medical history presents to the department today after sustaining loss of consciousness x2 this morning while on the toilet trying to have a bowel movement.  Patient reports that she was having some constipation this weekend, secondary to change in diet.  This morning she woke up and was having some loose stool, or diarrhea.  She reports that she started to feel lightheaded, dizzy, and the next thing she knew she woke up on the floor with her husband beside her.  The patient did hit her head on the vinyl floor tiling.  She is not on any blood thinners. At this time she laid out flat, still feeling somewhat lightheaded and dizzy, diaphoretic, however she needed to use the restroom again and so return to the toilet.  At this time she had another loss of consciousness, and was caught by her husband.  Prior to the event this morning patient denies any feelings of illness, no chest pain, and no shortness of breath, no fever, no cough, no dysuria, no vomiting, some nausea.  At time of interview patient denies ongoing nausea.   Loss of Consciousness Associated symptoms: diaphoresis   Associated symptoms: no chest pain, no palpitations, no seizures and no shortness of breath       No past medical history on file.  There are no problems to display for this patient.   Past Surgical History:  Procedure Laterality Date   PARTIAL HYSTERECTOMY  08/23/2017   UPPER GI ENDOSCOPY  1989   Dr. Melina Copa     OB History     Gravida  1   Para      Term      Preterm      AB      Living         SAB      IAB      Ectopic      Multiple      Live Births              Family History  Problem Relation Age of Onset   Liver cancer  Father    Lung cancer Father    Colon polyps Father     Social History   Tobacco Use   Smoking status: Never   Smokeless tobacco: Never  Vaping Use   Vaping Use: Never used  Substance Use Topics   Alcohol use: Yes    Alcohol/week: 2.0 standard drinks    Types: 2 Glasses of wine per week   Drug use: Never    Home Medications Prior to Admission medications   Medication Sig Start Date End Date Taking? Authorizing Provider  acetaminophen (TYLENOL) 500 MG tablet Take 1,000 mg by mouth every 6 (six) hours as needed for mild pain.   Yes [provider]  Ibuprofen 200 MG CAPS Take 400 mg by mouth 2 (two) times daily as needed (pain/headache).   Yes [provider]    Allergies    Aspirin  Review of Systems   Review of Systems  Constitutional:  Positive for diaphoresis. Negative for fatigue.  Respiratory:  Negative for chest tightness and shortness of breath.   Cardiovascular:  Positive for syncope. Negative for chest pain and palpitations.  Gastrointestinal:  Positive for constipation and diarrhea.  Neurological:  Negative for seizures and numbness.  All other systems reviewed and are negative.  Physical Exam Updated Vital Signs BP 129/82   Pulse 78   Temp 98 F (36.7 C) (Oral)   Resp 13   LMP 06/12/2017   SpO2 97%   Physical Exam Vitals and nursing note reviewed.  Constitutional:      General: She is not in acute distress.    Appearance: Normal appearance.     Comments: Somewhat pale, diaphoretic on arrival, normal color and without diaphoresis on reevaluation.  HENT:     Head: Normocephalic and atraumatic.  Eyes:     General:        Right eye: No discharge.        Left eye: No discharge.  Cardiovascular:     Rate and Rhythm: Normal rate and regular rhythm.     Heart sounds: No murmur heard.   No friction rub. No gallop.  Pulmonary:     Effort: Pulmonary effort is normal.     Breath sounds: Normal breath sounds.  Abdominal:     General:  Bowel sounds are normal.     Palpations: Abdomen is soft.  Skin:    General: Skin is warm and dry.     Capillary Refill: Capillary refill takes less than 2 seconds.     Comments: Small red mark on right forehead, without obvious hematoma, minimal to no swelling, no laceration or excoriation  Neurological:     Mental Status: She is alert and oriented to person, place, and time.  Psychiatric:        Mood and Affect: Mood normal.        Behavior: Behavior normal.    ED Results / Procedures / Treatments   Labs (all labs ordered are listed, but only abnormal results are displayed) Labs Reviewed  COMPREHENSIVE METABOLIC PANEL - Abnormal; Notable for the following components:      Result Value   Glucose, Bld 108 (*)    Calcium 8.8 (*)    All other components within normal limits  CBC WITH DIFFERENTIAL/PLATELET    EKG EKG Interpretation  Date/Time:  Tuesday May 04 2021 08:18:05 EDT Ventricular Rate:  67 PR Interval:  175 QRS Duration: 93 QT Interval:  396 QTC Calculation: 418 R Axis:   -11 Text Interpretation: Sinus rhythm RSR' in V1 or V2, probably normal variant Confirmed by Aurora ([id number]) on 05/04/2021 8:38:58 AM  Radiology CT HEAD WO CONTRAST (5MM)  Result Date: 05/04/2021 CLINICAL DATA:  Moderate to severe head trauma. Syncopal episode with fall and head injury. EXAM: CT HEAD WITHOUT CONTRAST TECHNIQUE: Contiguous axial images were obtained from the base of the skull through the vertex without intravenous contrast. COMPARISON:  None. FINDINGS: Brain: There is no evidence for acute hemorrhage, hydrocephalus, mass lesion, or abnormal extra-axial fluid collection. No definite CT evidence for acute infarction. Vascular: No hyperdense vessel or unexpected calcification. Skull: No evidence for fracture. No worrisome lytic or sclerotic lesion. Sinuses/Orbits: The visualized paranasal sinuses and mastoid air cells are clear. Visualized portions of the globes and intraorbital  fat are unremarkable. Other: None. IMPRESSION: Unremarkable study.  No acute intracranial abnormality. Electronically Signed   By: Misty Stanley M.D.   On: 05/04/2021 09:14    Procedures Procedures   Medications Ordered in ED Medications  acetaminophen (TYLENOL) tablet 325 mg (325 mg Oral Given 05/04/21 0846)    ED Course  I have reviewed the triage  vital signs and the nursing notes.  Pertinent labs & imaging results that were available during my care of the patient were reviewed by me and considered in my medical decision making (see chart for details).    MDM Rules/Calculators/A&P                         Lab work without abnormality.  CT head without abnormality.  Patient is well-appearing, stable at time of reevaluation.  Based on patient's history, patient likely underwent a vasovagal response while sitting on the toilet this morning.  Do not favor acute intracranial abnormality at this time.  Patient with no focal neurologic deficits.  No evidence of cardiovascular disease, or arrhythmia at this time.  Normal EKG, and normal heart rhythm on multiple assessments during visit.  Does not endorse any feelings of palpitations before or after her syncopal event. Patient appears stable for d/c at this time. Return precautions discussed. Final Clinical Impression(s) / ED Diagnoses Final diagnoses:  Syncope, unspecified syncope type    Rx / DC Orders ED Discharge Orders     None        Dorien Chihuahua 05/04/21 1054    Teressa Lower, MD 05/04/21 1554

## 2021-05-18 ENCOUNTER — Other Ambulatory Visit: Payer: Self-pay | Admitting: Family

## 2021-05-18 DIAGNOSIS — Z1231 Encounter for screening mammogram for malignant neoplasm of breast: Secondary | ICD-10-CM

## 2021-07-05 ENCOUNTER — Ambulatory Visit
Admission: RE | Admit: 2021-07-05 | Discharge: 2021-07-05 | Disposition: A | Discharge status: Home / Self Care | Payer: Managed Care, Other (non HMO) | Source: Ambulatory Visit | Attending: Family | Admitting: Family

## 2021-07-05 ENCOUNTER — Other Ambulatory Visit: Payer: Self-pay

## 2021-07-05 DIAGNOSIS — Z1231 Encounter for screening mammogram for malignant neoplasm of breast: Secondary | ICD-10-CM

## 2022-06-06 ENCOUNTER — Other Ambulatory Visit: Payer: Self-pay | Admitting: Obstetrics & Gynecology

## 2022-06-06 DIAGNOSIS — Z1231 Encounter for screening mammogram for malignant neoplasm of breast: Secondary | ICD-10-CM

## 2022-07-07 ENCOUNTER — Ambulatory Visit
Admission: RE | Admit: 2022-07-07 | Discharge: 2022-07-07 | Disposition: A | Discharge status: Home / Self Care | Payer: 59 | Source: Ambulatory Visit | Attending: Obstetrics & Gynecology | Admitting: Obstetrics & Gynecology

## 2022-07-07 DIAGNOSIS — Z1231 Encounter for screening mammogram for malignant neoplasm of breast: Secondary | ICD-10-CM

## 2023-03-03 ENCOUNTER — Encounter: Payer: Self-pay | Admitting: Gastroenterology

## 2023-03-14 ENCOUNTER — Encounter: Payer: Self-pay | Admitting: Gastroenterology

## 2023-05-09 ENCOUNTER — Ambulatory Visit (AMBULATORY_SURGERY_CENTER): Payer: 59 | Admitting: *Deleted

## 2023-05-09 VITALS — Ht 65.0 in | Wt 149.0 lb

## 2023-05-09 DIAGNOSIS — Z1211 Encounter for screening for malignant neoplasm of colon: Secondary | ICD-10-CM

## 2023-05-09 DIAGNOSIS — Z83719 Family history of colon polyps, unspecified: Secondary | ICD-10-CM

## 2023-05-09 MED ORDER — NA SULFATE-K SULFATE-MG SULF 17.5-3.13-1.6 GM/177ML PO SOLN: 1.0000 | Freq: Once | ORAL | 0 refills | Status: AC

## 2023-05-09 NOTE — Progress Notes (Signed)
Pt's name and DOB verified at the beginning of the pre-visit.  Pt denies any difficulty with ambulating,sitting, laying down or rolling side to side Gave both [facility] main # and MD on call # prior to instructions.  No egg or soy allergy known to patient  No issues known to pt with past sedation with any surgeries or procedures Pt denies having issues being intubated denies ever being intubatedted Pt has no issues moving head neck or swallowing No FH of Malignant Hyperthermia Pt is not on diet pills Pt is not on home 02  Pt is not on blood thinners  Pt denies issues with constipation  Pt is not on dialysis Pt denise any abnormal heart rhythms  Pt denies any upcoming cardiac testing Pt encouraged to use to use Singlecare or Goodrx to reduce cost  Patient's chart reviewed by Isabella Rodriguez CNRA prior to pre-visit and patient appropriate for the [facility].  Pre-visit completed and red dot placed by patient's name on their procedure day (on provider's schedule).  . Visit by phone Pt states weight is 149 lb Instructed pt why it is important to and  to call if they have any changes in health or new medications. Directed them to the # given and on instructions.   Pt states they will.  Instructions reviewed with pt and pt states understanding. Instructed to review again prior to procedure. Pt states they will.  Instructions sent by mail with coupon and by my chart

## 2023-05-23 ENCOUNTER — Other Ambulatory Visit: Payer: Self-pay | Admitting: Obstetrics & Gynecology

## 2023-05-23 DIAGNOSIS — Z1231 Encounter for screening mammogram for malignant neoplasm of breast: Secondary | ICD-10-CM

## 2023-05-24 ENCOUNTER — Encounter: Payer: Self-pay | Admitting: Gastroenterology

## 2023-05-30 ENCOUNTER — Encounter: Payer: Self-pay | Admitting: Gastroenterology

## 2023-05-30 ENCOUNTER — Ambulatory Visit (AMBULATORY_SURGERY_CENTER): Payer: 59 | Admitting: Gastroenterology

## 2023-05-30 VITALS — BP 136/83 | HR 76 | Temp 97.1°F | Resp 12 | Ht 65.0 in | Wt 149.0 lb

## 2023-05-30 DIAGNOSIS — Z83719 Family history of colon polyps, unspecified: Secondary | ICD-10-CM | POA: Diagnosis not present

## 2023-05-30 DIAGNOSIS — Z1211 Encounter for screening for malignant neoplasm of colon: Secondary | ICD-10-CM | POA: Diagnosis present

## 2023-05-30 MED ORDER — SODIUM CHLORIDE 0.9 % IV SOLN: 500.0000 mL | Freq: Once | INTRAVENOUS | Status: AC

## 2023-05-30 NOTE — Progress Notes (Signed)
Report to PACU, RN, vss, BBS= Clear.  

## 2023-05-30 NOTE — Patient Instructions (Signed)
YOU HAD AN ENDOSCOPIC PROCEDURE TODAY AT THE [facility]:   Refer to the procedure report that was given to you for any specific questions about what was found during the examination.  If the procedure report does not answer your questions, please call your gastroenterologist to clarify.  If you requested that your care partner not be given the details of your procedure findings, then the procedure report has been included in a sealed envelope for you to review at your convenience later.  YOU SHOULD EXPECT: Some feelings of bloating in the abdomen. Passage of more gas than usual.  Walking can help get rid of the air that was put into your GI tract during the procedure and reduce the bloating. If you had a lower endoscopy (such as a colonoscopy or flexible sigmoidoscopy) you may notice spotting of blood in your stool or on the toilet paper. If you underwent a bowel prep for your procedure, you may not have a normal bowel movement for a few days.  Please Note:  You might notice some irritation and congestion in your nose or some drainage.  This is from the oxygen used during your procedure.  There is no need for concern and it should clear up in a day or so.  SYMPTOMS TO REPORT IMMEDIATELY:  Following lower endoscopy (colonoscopy or flexible sigmoidoscopy):  Excessive amounts of blood in the stool  Significant tenderness or worsening of abdominal pains  Swelling of the abdomen that is new, acute  Fever of 100F or higher   For urgent or emergent issues, a gastroenterologist can be reached at any hour by calling [phone]. Do not use MyChart messaging for urgent concerns.    DIET:  We do recommend a small meal at first, but then you may proceed to your regular diet.  Drink plenty of fluids but you should avoid alcoholic beverages for 24 hours.  MEDICATIONS: Continue present medications.  FOLLOW UP: Repeat colonoscopy in 10 years for screening purposes. Earlier, if you experience  any new problems or have a change in family history.  Please see handouts given to you by your recovery nurse.  Thank you for allowing [place] to provide for your healthcare needs today.  ACTIVITY:  You should plan to take it easy for the rest of today and you should NOT DRIVE or use heavy machinery until tomorrow (because of the sedation medicines used during the test).    FOLLOW UP: Our staff will call the number listed on your records the next business day following your procedure.  We will call around 7:15- 8:00 am to check on you and address any questions or concerns that you may have regarding the information given to you following your procedure. If we do not reach you, we will leave a message.     If any biopsies were taken you will be contacted by phone or by letter within the next 1-3 weeks.  Please call us at [phone] if you have not heard about the biopsies in 3 weeks.    SIGNATURES/CONFIDENTIALITY: You and/or your care partner have signed paperwork which will be entered into your electronic medical record.  These signatures attest to the fact that that the information above on your After Visit Summary has been reviewed and is understood.  Full responsibility of the confidentiality of this discharge information lies with you and/or your care-partner.

## 2023-05-30 NOTE — Progress Notes (Signed)
Pt's states no medical or surgical changes since previsit or office visit. 

## 2023-05-30 NOTE — Progress Notes (Signed)
[facility] History and Physical   Primary Care Physician:  Philemon Kingdom, MD   Reason for Procedure:   FH polyps  Plan:     colonoscopy     HPI: Isabella Rodriguez is a 57 y.o. female    Past Medical History:  Diagnosis Date   Hypertension     Past Surgical History:  Procedure Laterality Date   PARTIAL HYSTERECTOMY  08/23/2017   UPPER GI ENDOSCOPY  1989   Dr. Charm Barges   WRIST SURGERY     screws and risk    Prior to Admission medications   Medication Sig Start Date End Date Taking? Authorizing Provider  acetaminophen (TYLENOL) 500 MG tablet Take 1,000 mg by mouth every 6 (six) hours as needed for mild pain.    [provider]  Ibuprofen 200 MG CAPS Take 400 mg by mouth 2 (two) times daily as needed (pain/headache).    [provider]    Current Outpatient Medications  Medication Sig Dispense Refill   acetaminophen (TYLENOL) 500 MG tablet Take 1,000 mg by mouth every 6 (six) hours as needed for mild pain.     Ibuprofen 200 MG CAPS Take 400 mg by mouth 2 (two) times daily as needed (pain/headache).     Current Facility-Administered Medications  Medication Dose Route Frequency Provider Last Rate Last Admin   0.9 %  sodium chloride infusion  500 mL Intravenous Once Lynann Bologna, MD       0.9 %  sodium chloride infusion  500 mL Intravenous Once Lynann Bologna, MD        Allergies as of 05/30/2023 - Review Complete 05/09/2023  Allergen Reaction Noted   Aspirin  01/26/2018    Family History  Problem Relation Age of Onset   Liver cancer Father    Lung cancer Father    Colon polyps Father    Colon cancer Neg Hx    Esophageal cancer Neg Hx    Rectal cancer Neg Hx    Stomach cancer Neg Hx     Social History   Socioeconomic History   Marital status: Married    Spouse name: Not on file   Number of children: Not on file   Years of education: Not on file   Highest education level: Not on file  Occupational History   Not on file   Tobacco Use   Smoking status: Never   Smokeless tobacco: Never  Vaping Use   Vaping status: Never Used  Substance and Sexual Activity   Alcohol use: Yes    Alcohol/week: 2.0 standard drinks of alcohol    Types: 2 Glasses of wine per week   Drug use: Never   Sexual activity: Not on file  Other Topics Concern   Not on file  Social History Narrative   Not on file   Social Determinants of Health   Financial Resource Strain: Not on file  Food Insecurity: Not on file  Transportation Needs: Not on file  Physical Activity: Not on file  Stress: Not on file  Social Connections: Not on file  Intimate Partner Violence: Not on file    Review of Systems: Positive for none All other review of systems negative except as mentioned in the HPI.  Physical Exam: Vital signs in last 24 hours: @VSRANGES @   General:   Alert,  Well-developed, well-nourished, pleasant and cooperative in NAD Lungs:  Clear throughout to auscultation.   Heart:  Regular rate and rhythm; no murmurs, clicks, rubs,  or gallops. Abdomen:  Soft, nontender and nondistended. Normal bowel sounds.   Neuro/Psych:  Alert and cooperative. Normal mood and affect. A and O x 3    No significant changes were identified.  The patient continues to be an appropriate candidate for the planned procedure and anesthesia.   Edman Circle, MD. [facility] 05/30/2023 8:12 AM@

## 2023-05-30 NOTE — Op Note (Signed)
[facility] Patient Name: Isabella Rodriguez Procedure Date: 05/30/2023 8:14 AM MRN: [id number] Endoscopist: Lynann Bologna , MD, [id number] Age: 57 Referring MD:  Date of Birth: December 25, 1965 Gender: Female Account #: [id number] Procedure:                Colonoscopy Indications:              CRC screening. FH colon polyps. Neg colon 01/2018 Medicines:                Monitored Anesthesia Care Procedure:                Pre-Anesthesia Assessment:                           - Prior to the procedure, a History and Physical                            was performed, and patient medications and                            allergies were reviewed. The patient's tolerance of                            previous anesthesia was also reviewed. The risks                            and benefits of the procedure and the sedation                            options and risks were discussed with the patient.                            All questions were answered, and informed consent                            was obtained. Prior Anticoagulants: The patient has                            taken no anticoagulant or antiplatelet agents. ASA                            Grade Assessment: I - A normal, healthy patient.                            After reviewing the risks and benefits, the patient                            was deemed in satisfactory condition to undergo the                            procedure.                           After obtaining informed consent, the colonoscope  was passed under direct vision. Throughout the                            procedure, the patient's blood pressure, pulse, and                            oxygen saturations were monitored continuously. The                            [id number]-HQ190L Colonoscope [id number] was introduced                            through the anus and advanced to the the cecum,                            identified by appendiceal  orifice and ileocecal                            valve. The colonoscopy was performed without                            difficulty. The patient tolerated the procedure                            well. The quality of the bowel preparation was                            good. The ileocecal valve, appendiceal orifice, and                            rectum were photographed. Scope In: 8:29:15 AM Scope Out: 8:48:31 AM Scope Withdrawal Time: 0 hours 10 minutes 47 seconds  Total Procedure Duration: 0 hours 19 minutes 16 seconds  Findings:                 Non-bleeding internal hemorrhoids were found during                            retroflexion. The hemorrhoids were small and Grade                            I (internal hemorrhoids that do not prolapse).                           The exam was otherwise without abnormality on                            direct and retroflexion views. Complications:            No immediate complications. Estimated Blood Loss:     Estimated blood loss: none. Impression:               - Minimal internal hemorrhoids.                           - The examination was otherwise  normal on direct                            and retroflexion views.                           - No specimens collected. Recommendation:           - Patient has a contact number available for                            emergencies. The signs and symptoms of potential                            delayed complications were discussed with the                            patient. Return to normal activities tomorrow.                            Written discharge instructions were provided to the                            patient.                           - Resume previous diet.                           - Continue present medications.                           - Repeat colonoscopy in 10 years for screening                            purposes. Earlier, if with any new problems or                             change in family history.                           - The findings and recommendations were discussed                            with the patient's family. Lynann Bologna, MD 05/30/2023 8:53:04 AM This report has been signed electronically.

## 2023-05-31 ENCOUNTER — Telehealth: Payer: Self-pay | Admitting: *Deleted

## 2023-05-31 NOTE — Telephone Encounter (Signed)
  Follow up Call-     05/30/2023    8:12 AM  Call back number  Post procedure Call Back phone  # [phone]  Permission to leave phone message Yes     Patient questions:  Do you have a fever, pain , or abdominal swelling? No. Pain Score  0 *  Have you tolerated food without any problems? Yes.    Have you been able to return to your normal activities? Yes.    Do you have any questions about your discharge instructions: Diet   No. Medications  No. Follow up visit  No.  Do you have questions or concerns about your Care? No.  Actions: * If pain score is 4 or above: No action needed, pain <4.

## 2023-07-10 ENCOUNTER — Ambulatory Visit
Admission: RE | Admit: 2023-07-10 | Discharge: 2023-07-10 | Disposition: A | Discharge status: Home / Self Care | Payer: 59 | Source: Ambulatory Visit | Attending: Obstetrics & Gynecology | Admitting: Obstetrics & Gynecology

## 2023-07-10 DIAGNOSIS — Z1231 Encounter for screening mammogram for malignant neoplasm of breast: Secondary | ICD-10-CM

## 2024-05-27 ENCOUNTER — Other Ambulatory Visit: Payer: Self-pay | Admitting: Obstetrics & Gynecology

## 2024-05-27 DIAGNOSIS — Z1231 Encounter for screening mammogram for malignant neoplasm of breast: Secondary | ICD-10-CM

## 2024-07-10 ENCOUNTER — Ambulatory Visit
Admission: RE | Admit: 2024-07-10 | Discharge: 2024-07-10 | Disposition: A | Discharge status: Home / Self Care | Source: Ambulatory Visit | Attending: Obstetrics & Gynecology | Admitting: Obstetrics & Gynecology

## 2024-07-10 DIAGNOSIS — Z1231 Encounter for screening mammogram for malignant neoplasm of breast: Secondary | ICD-10-CM
